# Patient Record
Sex: Female | Born: 1995 | Race: White | Hispanic: No | Marital: Single | Smoking: Never smoker
Health system: Southern US, Community
[De-identification: ages and names within clinical notes are randomized; demographics above are authoritative.]

## PROBLEM LIST (undated history)

## (undated) ENCOUNTER — Inpatient Hospital Stay: Payer: Self-pay

## (undated) HISTORY — PX: WISDOM TOOTH EXTRACTION: SHX21

---

## 2017-08-14 ENCOUNTER — Observation Stay
Admission: EM | Admit: 2017-08-14 | Discharge: 2017-08-15 | Disposition: A | Discharge status: Home / Self Care | Payer: Self-pay | Attending: Obstetrics and Gynecology | Admitting: Obstetrics and Gynecology

## 2017-08-14 DIAGNOSIS — E669 Obesity, unspecified: Secondary | ICD-10-CM | POA: Insufficient documentation

## 2017-08-14 DIAGNOSIS — O23512 Infections of cervix in pregnancy, second trimester: Secondary | ICD-10-CM | POA: Insufficient documentation

## 2017-08-14 DIAGNOSIS — O0932 Supervision of pregnancy with insufficient antenatal care, second trimester: Secondary | ICD-10-CM | POA: Insufficient documentation

## 2017-08-14 DIAGNOSIS — O4690 Antepartum hemorrhage, unspecified, unspecified trimester: Secondary | ICD-10-CM | POA: Diagnosis present

## 2017-08-14 DIAGNOSIS — Z3A26 26 weeks gestation of pregnancy: Secondary | ICD-10-CM | POA: Insufficient documentation

## 2017-08-14 DIAGNOSIS — O98812 Other maternal infectious and parasitic diseases complicating pregnancy, second trimester: Secondary | ICD-10-CM | POA: Insufficient documentation

## 2017-08-14 DIAGNOSIS — A749 Chlamydial infection, unspecified: Secondary | ICD-10-CM | POA: Insufficient documentation

## 2017-08-14 DIAGNOSIS — O99212 Obesity complicating pregnancy, second trimester: Secondary | ICD-10-CM | POA: Insufficient documentation

## 2017-08-14 DIAGNOSIS — O368120 Decreased fetal movements, second trimester, not applicable or unspecified: Principal | ICD-10-CM | POA: Insufficient documentation

## 2017-08-14 NOTE — OB Triage Note (Signed)
Patient arrived in triage with c/o vaginal bleeding and decreased fetal movement. Patient reports recently moving to the area and has not had prenatal care since approx 7 weeks. Reports vaginal bleeding throughout the pregnancy that increased this morning, but no bleeding at the present moment.  Also states she had felt fetal movement up until 2.5 days ago, but has not felt any movement since then and was worried.

## 2017-08-15 ENCOUNTER — Other Ambulatory Visit: Payer: Self-pay | Admitting: *Deleted

## 2017-08-15 ENCOUNTER — Observation Stay (HOSPITAL_BASED_OUTPATIENT_CLINIC_OR_DEPARTMENT_OTHER)
Admit: 2017-08-15 | Discharge: 2017-08-15 | Disposition: A | Discharge status: Home / Self Care | Payer: Self-pay | Attending: Obstetrics & Gynecology | Admitting: Obstetrics & Gynecology

## 2017-08-15 ENCOUNTER — Other Ambulatory Visit: Payer: Self-pay

## 2017-08-15 DIAGNOSIS — O368390 Maternal care for abnormalities of the fetal heart rate or rhythm, unspecified trimester, not applicable or unspecified: Secondary | ICD-10-CM

## 2017-08-15 DIAGNOSIS — O98819 Other maternal infectious and parasitic diseases complicating pregnancy, unspecified trimester: Secondary | ICD-10-CM

## 2017-08-15 DIAGNOSIS — A749 Chlamydial infection, unspecified: Secondary | ICD-10-CM | POA: Insufficient documentation

## 2017-08-15 DIAGNOSIS — O4690 Antepartum hemorrhage, unspecified, unspecified trimester: Secondary | ICD-10-CM

## 2017-08-15 LAB — URINALYSIS, ROUTINE W REFLEX MICROSCOPIC
Bacteria, UA: NONE SEEN
Bilirubin Urine: NEGATIVE
GLUCOSE, UA: NEGATIVE mg/dL
KETONES UR: NEGATIVE mg/dL
Nitrite: NEGATIVE
PH: 6 (ref 5.0–8.0)
Protein, ur: NEGATIVE mg/dL
Specific Gravity, Urine: 1.018 (ref 1.005–1.030)

## 2017-08-15 LAB — COMPREHENSIVE METABOLIC PANEL
ALBUMIN: 3.1 g/dL — AB (ref 3.5–5.0)
ALK PHOS: 61 U/L (ref 38–126)
ALT: 12 U/L — ABNORMAL LOW (ref 14–54)
ANION GAP: 10 (ref 5–15)
AST: 18 U/L (ref 15–41)
BUN: 6 mg/dL (ref 6–20)
CHLORIDE: 106 mmol/L (ref 101–111)
CO2: 20 mmol/L — AB (ref 22–32)
Calcium: 8.9 mg/dL (ref 8.9–10.3)
Creatinine, Ser: 0.54 mg/dL (ref 0.44–1.00)
GFR calc Af Amer: >60 mL/min (ref >60)
GFR calc non Af Amer: >60 mL/min (ref >60)
GLUCOSE: 105 mg/dL — AB (ref 65–99)
POTASSIUM: 3.4 mmol/L — AB (ref 3.5–5.1)
SODIUM: 136 mmol/L (ref 135–145)
Total Bilirubin: 0.5 mg/dL (ref 0.3–1.2)
Total Protein: 6.9 g/dL (ref 6.5–8.1)

## 2017-08-15 LAB — CBC
HEMATOCRIT: 34.9 % — AB (ref 35.0–47.0)
HEMOGLOBIN: 11.7 g/dL — AB (ref 12.0–16.0)
MCH: 29.6 pg (ref 26.0–34.0)
MCHC: 33.6 g/dL (ref 32.0–36.0)
MCV: 88.3 fL (ref 80.0–100.0)
Platelets: 257 10*3/uL (ref 150–440)
RBC: 3.95 MIL/uL (ref 3.80–5.20)
RDW: 14.5 % (ref 11.5–14.5)
WBC: 14.2 10*3/uL — ABNORMAL HIGH (ref 3.6–11.0)

## 2017-08-15 LAB — CHLAMYDIA/NGC RT PCR (ARMC ONLY)
CHLAMYDIA TR: DETECTED — AB
N gonorrhoeae: NOT DETECTED

## 2017-08-15 LAB — DIFFERENTIAL
Basophils Absolute: 0 10*3/uL (ref 0–0.1)
Basophils Relative: 0 %
EOS ABS: 0.3 10*3/uL (ref 0–0.7)
Eosinophils Relative: 2 %
LYMPHS ABS: 3 10*3/uL (ref 1.0–3.6)
Lymphocytes Relative: 21 %
Monocytes Absolute: 1.4 10*3/uL — ABNORMAL HIGH (ref 0.2–0.9)
Monocytes Relative: 10 %
Neutro Abs: 9.5 10*3/uL — ABNORMAL HIGH (ref 1.4–6.5)
Neutrophils Relative %: 67 %

## 2017-08-15 LAB — TYPE AND SCREEN
ABO/RH(D): A POS
ANTIBODY SCREEN: NEGATIVE

## 2017-08-15 LAB — WET PREP, GENITAL
CLUE CELLS WET PREP: NONE SEEN
Sperm: NONE SEEN
Trich, Wet Prep: NONE SEEN
Yeast Wet Prep HPF POC: NONE SEEN

## 2017-08-15 LAB — URINE DRUG SCREEN, QUALITATIVE (ARMC ONLY)
Amphetamines, Ur Screen: NOT DETECTED
BENZODIAZEPINE, UR SCRN: NOT DETECTED
Barbiturates, Ur Screen: NOT DETECTED
Cannabinoid 50 Ng, Ur ~~LOC~~: NOT DETECTED
Cocaine Metabolite,Ur ~~LOC~~: NOT DETECTED
MDMA (Ecstasy)Ur Screen: NOT DETECTED
METHADONE SCREEN, URINE: NOT DETECTED
Opiate, Ur Screen: NOT DETECTED
PHENCYCLIDINE (PCP) UR S: NOT DETECTED
TRICYCLIC, UR SCREEN: NOT DETECTED

## 2017-08-15 LAB — RAPID HIV SCREEN (HIV 1/2 AB+AG)
HIV 1/2 Antibodies: NONREACTIVE
HIV-1 P24 Antigen - HIV24: NONREACTIVE

## 2017-08-15 LAB — HEPATITIS B SURFACE ANTIGEN: Hepatitis B Surface Ag: NEGATIVE

## 2017-08-15 MED ORDER — NITROFURANTOIN MONOHYD MACRO 100 MG PO CAPS: 100.0000 mg | ORAL_CAPSULE | Freq: Two times a day (BID) | ORAL | Status: DC

## 2017-08-15 MED ORDER — CEFTRIAXONE SODIUM 250 MG IJ SOLR
250.0000 mg | Freq: Once | INTRAMUSCULAR | Status: AC
  Administered 2017-08-15: 250 mg via INTRAMUSCULAR
  Filled 2017-08-15: qty 250

## 2017-08-15 MED ORDER — AZITHROMYCIN 500 MG PO TABS
1000.0000 mg | ORAL_TABLET | Freq: Once | ORAL | Status: AC
  Administered 2017-08-15: 1000 mg via ORAL
  Filled 2017-08-15: qty 2

## 2017-08-15 NOTE — Progress Notes (Signed)
Amy Holder is a 22 y.o. female. She is at [redacted]w[redacted]d gestation. No LMP recorded. Patient is pregnant. Estimated Date of Delivery: 11/19/17  Prenatal care site: No prenatal care, seen x 1 in early pregnancy for [place] at 7-8wks  Current pregnancy complicated by: ongoing vaginal bleeding with vaginal discharge.   Chief complaint: vaginal bleeding and decreased fetal movement.   Location: has had intermittent vaginal bleeding for months.   Quality: light [place] to red discharge most days, noted with wiping, worse earlier today with small dime size clots.   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 Has not sought prenatal care, states that she did apply for Medicaid.   S: Resting comfortably. No CTX, no LOF,  Active fetal movement.  Denies: HA, visual changes, SOB, or RUQ/epigastric pain  Maternal Medical History:  History reviewed. No pertinent past medical history. Obesity  Past Surgical History:  Procedure Laterality Date  . WISDOM TOOTH EXTRACTION      No Known Allergies  Prior to Admission medications   Not on File      Social History: She  reports that  has never smoked. she has never used smokeless tobacco. She reports that she does not drink alcohol or use drugs.  Family History: family history is not on file.  Review of Systems: A full review of systems was performed and negative except as noted in the HPI.     O:  BP 128/68 (BP Location: Left Arm)   Pulse 93   Temp 99.1 F (37.3 C) (Oral)   Resp 18   Ht 5\' 8"  (1.727 m)   Wt 228 lb (103.4 kg)   BMI 34.67 kg/m  Results for orders placed or performed during the hospital encounter of 08/14/17 (from the past 48 hour(s))  Wet prep, genital   Collection Time: 08/15/17  1:16 AM  Result Value Ref Range   Yeast Wet Prep HPF POC NONE SEEN NONE SEEN   Trich, Wet Prep NONE SEEN NONE SEEN   Clue Cells Wet Prep HPF POC NONE SEEN NONE SEEN   WBC, Wet  Prep HPF POC MANY (A) NONE SEEN   Sperm NONE SEEN   Urine Drug Screen, Qualitative ([facility] only)   Collection Time: 08/15/17  1:16 AM  Result Value Ref Range   Tricyclic, Ur Screen NONE DETECTED NONE DETECTED   Amphetamines, Ur Screen NONE DETECTED NONE DETECTED   MDMA (Ecstasy)Ur Screen NONE DETECTED NONE DETECTED   Cocaine Metabolite,Ur [place] NONE DETECTED NONE DETECTED   Opiate, Ur Screen NONE DETECTED NONE DETECTED   Phencyclidine (PCP) Ur S NONE DETECTED NONE DETECTED   Cannabinoid 50 Ng, Ur [place] NONE DETECTED NONE DETECTED   Barbiturates, Ur Screen NONE DETECTED NONE DETECTED   Benzodiazepine, Ur Scrn NONE DETECTED NONE DETECTED   Methadone Scn, Ur NONE DETECTED NONE DETECTED  Urinalysis, Routine w reflex microscopic   Collection Time: 08/15/17  1:16 AM  Result Value Ref Range   Color, Urine YELLOW (A) YELLOW   APPearance HAZY (A) CLEAR   Specific Gravity, Urine 1.018 1.005 - 1.030   pH 6.0 5.0 - 8.0   Glucose, UA NEGATIVE NEGATIVE mg/dL   Hgb urine dipstick MODERATE (A) NEGATIVE   Bilirubin Urine NEGATIVE NEGATIVE   Ketones, ur NEGATIVE NEGATIVE mg/dL   Protein, ur NEGATIVE NEGATIVE mg/dL   Nitrite NEGATIVE NEGATIVE   Leukocytes, UA LARGE (A) NEGATIVE   RBC /  HPF 6-30 0 - 5 RBC/hpf   WBC, UA TOO NUMEROUS TO COUNT 0 - 5 WBC/hpf   Bacteria, UA NONE SEEN NONE SEEN   Squamous Epithelial / LPF 0-5 (A) NONE SEEN   Mucus PRESENT   Comprehensive metabolic panel   Collection Time: 08/15/17  1:45 AM  Result Value Ref Range   Sodium 136 135 - 145 mmol/L   Potassium 3.4 (L) 3.5 - 5.1 mmol/L   Chloride 106 101 - 111 mmol/L   CO2 20 (L) 22 - 32 mmol/L   Glucose, Bld 105 (H) 65 - 99 mg/dL   BUN 6 6 - 20 mg/dL   Creatinine, Ser 4.78 0.44 - 1.00 mg/dL   Calcium 8.9 8.9 - 29.5 mg/dL   Total Protein 6.9 6.5 - 8.1 g/dL   Albumin 3.1 (L) 3.5 - 5.0 g/dL   AST 18 15 - 41 U/L   ALT 12 (L) 14 - 54 U/L   Alkaline Phosphatase 61 38 - 126 U/L   Total Bilirubin 0.5 0.3 - 1.2 mg/dL   GFR calc  non Af Amer >60 >60 mL/min   GFR calc Af Amer >60 >60 mL/min   Anion gap 10 5 - 15  CBC   Collection Time: 08/15/17  1:45 AM  Result Value Ref Range   WBC 14.2 (H) 3.6 - 11.0 K/uL   RBC 3.95 3.80 - 5.20 MIL/uL   Hemoglobin 11.7 (L) 12.0 - 16.0 g/dL   HCT 62.1 (L) 30.8 - 65.7 %   MCV 88.3 80.0 - 100.0 fL   MCH 29.6 26.0 - 34.0 pg   MCHC 33.6 32.0 - 36.0 g/dL   RDW 84.6 96.2 - 95.2 %   Platelets 257 150 - 440 K/uL  Differential   Collection Time: 08/15/17  1:45 AM  Result Value Ref Range   Neutrophils Relative % 67 %   Neutro Abs 9.5 (H) 1.4 - 6.5 K/uL   Lymphocytes Relative 21 %   Lymphs Abs 3.0 1.0 - 3.6 K/uL   Monocytes Relative 10 %   Monocytes Absolute 1.4 (H) 0.2 - 0.9 K/uL   Eosinophils Relative 2 %   Eosinophils Absolute 0.3 0 - 0.7 K/uL   Basophils Relative 0 %   Basophils Absolute 0.0 0 - 0.1 K/uL  Rapid HIV screen (HIV 1/2 Ab+Ag)   Collection Time: 08/15/17  1:45 AM  Result Value Ref Range   HIV-1 P24 Antigen - HIV24 NON REACTIVE NON REACTIVE   HIV 1/2 Antibodies NON REACTIVE NON REACTIVE   Interpretation (HIV Ag Ab)      A non reactive test result means that HIV 1 or HIV 2 antibodies and HIV 1 p24 antigen were not detected in the specimen.  Type and screen [facility]   Collection Time: 08/15/17  1:46 AM  Result Value Ref Range   ABO/RH(D) PENDING    Antibody Screen PENDING    Sample Expiration      08/18/2017 Performed at [facility], [address] [place], [place] [address]      Constitutional: NAD, AAOx3  HE/ENT: extraocular movements grossly intact, moist mucous membranes CV: RRR PULM: nl respiratory effort, CTABL     Abd: gravid, non-tender, non-distended, soft      Ext: Non-tender, Nonedematous   Psych: mood appropriate, speech normal Pelvic: SSE done, mod amt of purulent discharge noted from long/closed friable cervix, vaginal and vulvar erythema noted with blood tinged mucopurulent discharge.   Fetal   monitoring: FHR 150-160s with  audible frequent spells of arrythmia, lasting few seconds to minutes of bradycardic FHR of 60s. Moderate variability when tracing continuously, very difficult to obtain using external fetal monitor. CNM listed with Doppler for full 5 min auscultation.   Toco: no uterine activity    A/P: 22 y.o. 6341w2d here for antenatal surveillance for vaginal bleeding and decreased fetal movement x 2d.    Preterm labor: not present.   Fetal Wellbeing: overally appropriate for GA when tracing, persistent fetal arrhythmia noted. - plan for MFM consult if possible in AM  Prenatal labs: blood type pending, will need rhogam if Rn neg  Acute cervicitis, Rx Azithromycin and Rocephin single dose now  UA c/w urinary tract infection- no s/sx pyelo- start macrobid and culture pending.   SW consult placed due to no prenatal care- discussed with pt the importance of prenatal followup.    Taina Landry A, CNM 08/15/17  3:20 AM

## 2017-08-15 NOTE — OB Triage Note (Signed)
R. McVey CNM at bedside to assess fetal heart tones. Difficulty obtaining continuous heart tones with EFM. Doppler applied and heart tones obtained as noted.  Irregular heart beat noted. Difficult to continuously monitor FHT's due to severely irregular heart beat.  EFM reapplied per CNM. Continuing to attempt to obtain continuous FHT's.

## 2017-08-15 NOTE — OB Triage Note (Signed)
Patient reports feeling good fetal movement. Denies any vaginal bleeding or contractions at this time. Doppler heart tones obtained as charted. Audible irregular heart rate noted. CNM aware, at bedside now discussing plan of care with patient.

## 2017-08-15 NOTE — OB Triage Note (Signed)
Patient to [facility] for consult

## 2017-08-15 NOTE — Progress Notes (Signed)
Maternal-Fetal Medicine Consultation  Requesting Provider:  Heloise Ochoaebecca McVey CNM Reason for Consult: Irregular fetal heart rate  Ms. Caron Presumeink is a 22 year-old G1 P0 at 1026 2/7 weeks by 6 week ultrasound (EDC 03/28/17 by ultrasound performed on 03/28/17 at [facility] with measurements of 6 2/7 weeks) who [facility] with vaginal spotting.  She reports a small amount of [place]-tinged vaginal spotting throughout the pregnancy.  Yesterday, she had a slight increase in the amount of spotting.  She denies abdominal pain, contractions, pelvic pressure and reports good fetal movement.  She [place] prenatal care in [place] and had a 6 week ultraound there. She has not yet had an anatomy ultrasound. She then moved to [place] to be close to a friend. She works as a live-in Social workernanny and has not yet initated prenatal care in the area due to issues getting Medicaid during the government shut-down.  Pt received zithromax and ceftriaxone on L&D secondary to Gc/Chl test that showed chlamydia. She is also receiving macrobid secondary to leukouria.  PMH: Denies PSH: Denies POb: G1 P0 PGYN: Denies history of abnormal paps or STDs. Was diagnosed with chlyamydia during the current hospitalization and received treatment.  Meds: Vitamin D All: KNDA SH: Works as a live-in Social workernanny. Denies tobacco or drug use FH: No FH of babies with birth defects or FH genetic disorders  Examination: Temp 36.9, BP 130/60, Pulse 84, Resp 18 Abd: soft, ND, NT, no guarding or rebound. Fundus NT SSE: Performed on L&D, see admission H&P SVE: Performed on L&D, long and closed  NST from L&D: Baseline 140, moderate variability, Accels present. Multiple areas of drop out as fetus was difficult to keep on monitor Toco: no contractions  Prenatal labs: Blood type A positive, antibody screen neg, Rubella status pending, HIV neg, Hep B neg, RPR pending, varicella pending, Hct 34.9, MCV 88, Plt 257, WBC 14.2, Cr 0.54, AST 18, ALT 12,  Total Bili 0.5, GC neg, Chlamydia POSITIVE, urine tox neg.   Wet prep: numerous WBCs but no trich, clue cells or yeast UA: numerous WBCs, large leuk esterace, neg nitrite, no bacteria Urine C&S: Pending  Ultrasound: See [facility] from [facility]. Single live IUP at 26 2/7 weeks. Dating by 6 week US.  Anatomy appears normal. Occasional PACs. No hydrops or tachyarrhythmia. No previa. Cervix appears normal   Assessment and Recommendations: 22 year-old G1 P0 at 826 2/7 weeks admitted with vaginal spotting, cervicitis in setting of chlamydia infection, and concern for irregular fetal heart rate on doptones.  OB US was normal. Fetal heart rate was regular with occasional premature atrial contractions seen.  No evidence of preterm labor.  Discussed avoidance of stimulants such as caffeine, nicotine, chocolate, and cold medicines. She is using caffeine daily.  Discussed risk for progression to a tachyarrhythmia, but overal risk is low.    -[facility] to [facility] in one week for US to assess FHR rate/hydrops screen -Treated for chlamydia -Continue macrobid -Spoke with Ms. Yetta BarreJones. [facility] -Spotting likely from cervicitis but patient was instructed to return to L&D with any bleeding.  Haiden Rawlinson, [place] A, MD

## 2017-08-15 NOTE — Discharge Summary (Signed)
Obstetric Discharge Summary Reason for Admission:  Vaginal bleeding and decreased FM Prenatal Procedures: US today  Intrapartum Procedures: NONE Postpartum Procedures: N/A Complications-Operative and Postpartum: Fetal arrthymia seen by [facility] today  Hemoglobin  Date Value Ref Range Status  08/15/2017 11.7 (L) 12.0 - 16.0 g/dL Final   HCT  Date Value Ref Range Status  08/15/2017 34.9 (L) 35.0 - 47.0 % Final    Physical Exam:  General: alert, cooperative and appears stated age 46Lochia: none  Uterine Fundus: Gravid  DVT Evaluation: Neg   Discharge Diagnoses: IUP at 26 2/7 weeks with poor PNC, chlamydia and poss UTI, fetal arrthymia  Discharge Information: Date: 08/15/2017 Activity: Up ad lib Diet: Reg Medications:PNV Condition: stable  Instructions: FU at [facility] for new care as pt has no prenatal care Avoid stimulants such as caffeine and coffee.  FKC's discussed.  Discharge to: Home  Follow-up Information    [facility]. Schedule an appointment as soon as possible for a visit in 1 week(s).   Contact information: [address] [place] [id number] [phone]          Newborn Data: This patient has no babies on file.   Amy Holder 08/15/2017, 1:48 PM

## 2017-08-15 NOTE — OB Triage Note (Signed)
Attempted to apply EFM. Fetal movement palpated and pt reported feeling fetal movement at that time.  Intermittent heart tones noted at 145. Difficulty continuously monitoring FHT's. Pt denies feeling contractions or leaking of fluid. Plan to obtain doppler to locate FHT. Pt verbalized understanding and agreed to plan.

## 2017-08-16 ENCOUNTER — Other Ambulatory Visit: Payer: Self-pay | Admitting: Certified Nurse Midwife

## 2017-08-16 LAB — URINE CULTURE

## 2017-08-16 LAB — VARICELLA ZOSTER ANTIBODY, IGG: Varicella IgG: <135 index — ABNORMAL LOW (ref >165)

## 2017-08-16 LAB — RUBELLA SCREEN: Rubella: <0.9 index — ABNORMAL LOW (ref >0.99)

## 2017-08-16 LAB — RPR: RPR Ser Ql: NONREACTIVE

## 2017-08-19 ENCOUNTER — Other Ambulatory Visit: Payer: Self-pay | Admitting: *Deleted

## 2017-08-19 DIAGNOSIS — Z3A27 27 weeks gestation of pregnancy: Secondary | ICD-10-CM

## 2017-08-22 ENCOUNTER — Other Ambulatory Visit: Payer: Self-pay

## 2018-02-11 ENCOUNTER — Encounter (HOSPITAL_COMMUNITY): Payer: Self-pay
